# Patient Record
Sex: Male | Born: 1990 | Race: Black or African American | Hispanic: No | Smoking: Never smoker
Health system: Southern US, Community
[De-identification: ages and names within clinical notes are randomized; demographics above are authoritative.]

---

## 2014-05-01 ENCOUNTER — Emergency Department (HOSPITAL_COMMUNITY)
Admission: EM | Admit: 2014-05-01 | Discharge: 2014-05-01 | Disposition: A | Discharge status: Home / Self Care | Payer: BC Managed Care – PPO | Attending: Emergency Medicine | Admitting: Emergency Medicine

## 2014-05-01 ENCOUNTER — Encounter (HOSPITAL_COMMUNITY): Payer: Self-pay | Admitting: Emergency Medicine

## 2014-05-01 DIAGNOSIS — L739 Follicular disorder, unspecified: Secondary | ICD-10-CM | POA: Insufficient documentation

## 2014-05-01 DIAGNOSIS — K120 Recurrent oral aphthae: Secondary | ICD-10-CM | POA: Diagnosis not present

## 2014-05-01 DIAGNOSIS — R220 Localized swelling, mass and lump, head: Secondary | ICD-10-CM | POA: Diagnosis present

## 2014-05-01 MED ORDER — CEPHALEXIN 250 MG PO CAPS: 250.0000 mg | ORAL_CAPSULE | Freq: Four times a day (QID) | ORAL | Status: DC

## 2014-05-01 NOTE — ED Provider Notes (Signed)
CSN: [id number]     Arrival date & time 05/01/14  1806 History  This chart was scribed for non-physician practitioner, Raymon MuttonMarissa Nykeem Citro PA, working with Tilden FossaElizabeth Rees, MD, by Tanda RockersMargaux Venter, ED Scribe. This patient was seen in room TR08C/TR08C and the patient's care was started at 7:03 PM.    Chief Complaint  Patient presents with  . Abscess   The history is provided by the patient. No language interpreter was used.     HPI Comments: Russell Bernard is a 24 y.o. male with no pertinent past medical history who presents to the Emergency Department complaining of an abscess to his left lower lip that began 3 days ago. Pt states that it began as a whitehead. He mentions that the area has been increasing in size, swelling, and soreness in the last 3 days. He says that he has been applying a warm rag and soap to the area with no relief. Pt states that he goes to the barber shop for trimming of his face. He reports that the last time he went to the barber shop was a week ago. Pt also states that he has sores in his mouth that he noticed today. He describes the sores as "cuts" in his mouth. Pt reports that the pain is localized to his lip and denies any pain in his mouth from the sores.  He admits to being sexually active and practicing oral sex. Pt states that he always uses protection and his last encounter was last weekend. He denies drainage of the area around his lip as well as popping it at any time. Pt denies throat closing, tongue swelling, voice changes, blurred vision, neck pain or stiffness, difficulty swallowing, chest pain, shortness of breath, difficulty breathing, numbness or tingling to the face, or rashes, fever, chills. He also denies any recent travels.  PCP none   History reviewed. No pertinent past medical history. History reviewed. No pertinent past surgical history. No family history on file. History  Substance Use Topics  . Smoking status: Never Smoker   . Smokeless tobacco: Not on  file  . Alcohol Use: Yes    Review of Systems  Constitutional: Negative for fever and chills.  HENT: Positive for facial swelling and mouth sores. Negative for trouble swallowing and voice change.   Eyes: Negative for pain and visual disturbance.  Respiratory: Negative for shortness of breath.        No difficulty breathing.   Cardiovascular: Negative for chest pain.  Musculoskeletal: Negative for neck pain.  Skin: Negative for rash.       Abscess to left lower lip. No drainage.   Neurological: Negative for numbness.      Allergies  Review of patient's allergies indicates no known allergies.  Home Medications   Prior to Admission medications   Medication Sig Start Date End Date Taking? Authorizing Provider  cephALEXin (KEFLEX) 250 MG capsule Take 1 capsule (250 mg total) by mouth 4 (four) times daily. 05/01/14   Asah Lamay, PA-C   Triage Vitals: BP 134/94 mmHg  Pulse 77  Temp(Src) 98.1 F (36.7 C) (Oral)  Resp 17  Ht 5\' 9"  (1.753 m)  Wt 215 lb (97.523 kg)  BMI 31.74 kg/m2  SpO2 99%   Physical Exam  Constitutional: He is oriented to person, place, and time. He appears well-developed and well-nourished. No distress.  HENT:  Head: Normocephalic and atraumatic.  Mouth/Throat: Oropharynx is clear and moist. No oropharyngeal exudate.    Aphthous ulcer identified to the right  maxillary buccal mucosa noted - lateral to the right maxillary second premolar. Negative active drainage or bleeding noted. Mild discomfort upon palpation. Negative swelling. Negative erythema.  Eyes: Conjunctivae and EOM are normal. Pupils are equal, round, and reactive to light. Right eye exhibits no discharge. Left eye exhibits no discharge.  Neck: Normal range of motion. Neck supple. No tracheal deviation present.  Negative neck stiffness Negative nuchal rigidity Negative cervical lymphadenopathy Negative meningeal signs  Cardiovascular: Normal rate, regular rhythm and normal heart sounds.    Pulmonary/Chest: Effort normal and breath sounds normal. No respiratory distress. He has no wheezes. He has no rales.  Patient is able to speak in full sentences without difficulty Negative use of excess her muscles Negative stridor  Musculoskeletal: Normal range of motion.  Lymphadenopathy:    He has no cervical adenopathy.  Neurological: He is alert and oriented to person, place, and time. No cranial nerve deficit. He exhibits normal muscle tone. Coordination normal.  Skin: Skin is warm and dry.  Please see HEENT  Psychiatric: He has a normal mood and affect. His behavior is normal.  Nursing note and vitals reviewed.   ED Course  Procedures (including critical care time)  DIAGNOSTIC STUDIES: Oxygen Saturation is 99% on RA, normal by my interpretation.    COORDINATION OF CARE: 7:12 PM-Discussed treatment plan which includes HSV test and antibiotic prescription with pt at bedside and pt agreed to plan.   Labs Review Labs Reviewed  HSV 1 ANTIBODY, IGG  HSV 2 ANTIBODY, IGG    Imaging Review No results found.   EKG Interpretation None      MDM   Final diagnoses:  Folliculitis  Aphthous ulcer    Medications - No data to display  Filed Vitals:   05/01/14 1821  BP: 134/94  Pulse: 77  Temp: 98.1 F (36.7 C)  TempSrc: Oral  Resp: 17  Height:  (1.753 m)  Weight: 215 lb (97.523 kg)  SpO2: 99%   I personally performed the services described in this documentation, which was scribed in my presence. The recorded information has been reviewed and is accurate.  Patient presenting to the ED with a pustule localized to the lower lip, left aspects with surrounding swelling. Pustule was broken while in ED setting when pressure was applied-pus of a yellow/white color and blood was identified. Aphthous ulcer identified to the right maxillary buccal mucosa with negative active drainage or bleeding.  HSV 1 and 2 pending. Suspicion to be folliculitis and aphthous ulcer.  Patient stable, afebrile. Patient not septic appearing. Discharged patient. Discharge patient with Keflex. Discussed with patient to apply warm compressions and massage. Discussed with patient to report back to the ED with and 48 hours for reassessment. Discussed with patient to closely monitor symptoms and if symptoms are to worsen or change to report back to the ED - strict return instructions given.  Patient agreed to plan of care, understood, all questions answered.    Raymon Mutton, PA-C 05/01/14 1933  Tilden Fossa, MD 05/01/14 2325

## 2014-05-01 NOTE — ED Notes (Signed)
Pt reports he popped a pimple on his left lower lip on Wednesday and his lip has increased in size and became sore since, pt also reports that he has sores in his mouth.  Airway intact.

## 2014-05-01 NOTE — Discharge Instructions (Signed)
Please call your doctor for a followup appointment within 24-48 hours. When you talk to your doctor please let them know that you were seen in the emergency department and have them acquire all of your records so that they can discuss the findings with you and formulate a treatment plan to fully care for your new and ongoing problems. Please follow-up in the emergency department within 48 hours for reassessment of the lip Please take antibiotics as prescribed and on a full stomach Please rest and stay hydrated Please apply warm compressions at least 4-5 per day and massage at the lip Please gargle with warm water and salt for the canker sores Please avoid any sexual activity Please continue to monitor symptoms closely and if symptoms are to worsen or change (fever greater than 101, chills, sweating, nausea, vomiting, chest pain, shortness of breathe, difficulty breathing, weakness, numbness, tingling, worsening or changes to pain pattern, swelling to the lip, redness, active pus drainage, swelling to the jaw or to the gumline, bleeding) please report back to the Emergency Department immediately.    Canker Sores  Canker sores are painful, open sores on the inside of the mouth and cheek. They may be white or yellow. The sores usually heal in 1 to 2 weeks. Women are more likely than men to have recurrent canker sores. CAUSES The cause of canker sores is not well understood. More than one cause is likely. Canker sores do not appear to be caused by certain types of germs (viruses or bacteria). Canker sores may be caused by:  An allergic reaction to certain foods.  Digestive problems.  Not having enough vitamin B12, folic acid, and iron.  Male sex hormones. Sores may come only during certain phases of a menstrual cycle. Often, there is improvement during pregnancy.  Genetics. Some people seem to inherit canker sore problems. Emotional stress and injuries to the mouth may trigger outbreaks, but  not cause them.  DIAGNOSIS Canker sores are diagnosed by exam.  TREATMENT  Patients who have frequent bouts of canker sores may have cultures taken of the sores, blood tests, or allergy tests. This helps determine if their sores are caused by a poor diet, an allergy, or some other preventable or treatable disease.  Vitamins may prevent recurrences or reduce the severity of canker sores in people with poor nutrition.  Numbing ointments can relieve pain. These are available in drug stores without a prescription.  Anti-inflammatory steroid mouth rinses or gels may be prescribed by your caregiver for severe sores.  Oral steroids may be prescribed if you have severe, recurrent canker sores. These strong medicines can cause many side effects and should be used only under the close direction of a dentist or physician.  Mouth rinses containing the antibiotic medicine may be prescribed. They may lessen symptoms and speed healing. Healing usually happens in about 1 or 2 weeks with or without treatment. Certain antibiotic mouth rinses given to pregnant women and young children can permanently stain teeth. Talk to your caregiver about your treatment. HOME CARE INSTRUCTIONS   Avoid foods that cause canker sores for you.  Avoid citrus juices, spicy or salty foods, and coffee until the sores are healed.  Use a soft-bristled toothbrush.  Chew your food carefully to avoid biting your cheek.  Apply topical numbing medicine to the sore to help relieve pain.  Apply a thin paste of baking soda and water to the sore to help heal the sore.  Only use mouth rinses or medicines for  pain or discomfort as directed by your caregiver. SEEK MEDICAL CARE IF:   Your symptoms are not better in 1 week.  Your sores are still present after 2 weeks.  Your sores are very painful.  You have trouble breathing or swallowing.  Your sores come back frequently. Document Released: 06/10/2010 Document Revised:  06/10/2012 Document Reviewed: 06/10/2010 [facility] Patient Information 2015 [place], [place]. This information is not intended to replace advice given to you by your health care provider. Make sure you discuss any questions you have with your health care provider. Folliculitis  Folliculitis is redness, soreness, and swelling (inflammation) of the hair follicles. This condition can occur anywhere on the body. People with weakened immune systems, diabetes, or obesity have a greater risk of getting folliculitis. CAUSES  Bacterial infection. This is the most common cause.  Fungal infection.  Viral infection.  Contact with certain chemicals, especially oils and tars. Long-term folliculitis can result from bacteria that live in the nostrils. The bacteria may trigger multiple outbreaks of folliculitis over time. SYMPTOMS Folliculitis most commonly occurs on the scalp, thighs, legs, back, buttocks, and areas where hair is shaved frequently. An early sign of folliculitis is a small, white or yellow, pus-filled, itchy lesion (pustule). These lesions appear on a red, inflamed follicle. They are usually less than 0.2 inches (5 mm) wide. When there is an infection of the follicle that goes deeper, it becomes a boil or furuncle. A group of closely packed boils creates a larger lesion (carbuncle). Carbuncles tend to occur in hairy, sweaty areas of the body. DIAGNOSIS  Your caregiver can usually tell what is wrong by doing a physical exam. A sample may be taken from one of the lesions and tested in a lab. This can help determine what is causing your folliculitis. TREATMENT  Treatment may include:  Applying warm compresses to the affected areas.  Taking antibiotic medicines orally or applying them to the skin.  Draining the lesions if they contain a large amount of pus or fluid.  Laser hair removal for cases of long-lasting folliculitis. This helps to prevent regrowth of the hair. HOME CARE  INSTRUCTIONS  Apply warm compresses to the affected areas as directed by your caregiver.  If antibiotics are prescribed, take them as directed. Finish them even if you start to feel better.  You may take over-the-counter medicines to relieve itching.  Do not shave irritated skin.  Follow up with your caregiver as directed. SEEK IMMEDIATE MEDICAL CARE IF:   You have increasing redness, swelling, or pain in the affected area.  You have a fever. MAKE SURE YOU:  Understand these instructions.  Will watch your condition.  Will get help right away if you are not doing well or get worse. Document Released: 04/24/2001 Document Revised: 08/15/2011 Document Reviewed: 05/16/2011 [facility] Patient Information 2015 [place], [place]. This information is not intended to replace advice given to you by your health care provider. Make sure you discuss any questions you have with your health care provider.   Emergency Department Resource Guide 1) Find a Doctor and Pay Out of Pocket Although you won't have to find out who is covered by your insurance plan, it is a good idea to ask around and get recommendations. You will then need to call the office and see if the doctor you have chosen will accept you as a new patient and what types of options they offer for patients who are self-pay. Some doctors offer discounts or will set up payment plans for  their patients who do not have insurance, but you will need to ask so you aren't surprised when you get to your appointment.  2) Contact Your Local Health Department Not all health departments have doctors that can see patients for sick visits, but many do, so it is worth a call to see if yours does. If you don't know where your local health department is, you can check in your phone book. The CDC also has a tool to help you locate your state's health department, and many state websites also have listings of all of their local health departments.  3) Find a  Walk-in Clinic If your illness is not likely to be very severe or complicated, you may want to try a walk in clinic. These are popping up all over the country in pharmacies, drugstores, and shopping centers. They're usually staffed by nurse practitioners or physician assistants that have been trained to treat common illnesses and complaints. They're usually fairly quick and inexpensive. However, if you have serious medical issues or chronic medical problems, these are probably not your best option.  No Primary Care Doctor: - Call Health Connect at  [phone] - they can help you locate a primary care doctor that  accepts your insurance, provides certain services, etc. - Physician Referral Service- [phone]  Chronic Pain Problems: Organization         Address  Phone   Notes  [facility]  [phone] Patients need to be referred by their primary care doctor.   Medication Assistance: Organization         Address  Phone   Notes  [facility]ication [facility] [address] [place], [place] [address] [phone]) [phone] --Must be a resident of [place] -- Must have NO insurance coverage whatsoever (no Medicaid/ Medicare, etc.) -- The pt. MUST have a primary care doctor that directs their care regularly and follows them in the community   MedAssist  [phone]   [employer]  [phone]    Agencies that provide inexpensive medical care: Organization         Address  Phone   Notes  [facility]  [phone](336) [phone]   [facility]    [phone]) [phone]   [facility] [address] [place], [place] [address] [phone]) [phone]   [facility] [address], [place] [phone]   Planned Parenthood    [phone]) [phone]   [facility]    [phone]   [facility]  [address], [place] Phone:  [phone]) [phone], Fax:  [phone])  [phone] Hours of Operation:  9 am - 6 pm, M-F.  Also accepts Medicaid/Medicare and self-pay.  [facility]  [address], [place] Phone: [phone], Fax: [phone]. Hours of Operation:  8:30 am - 5:30 pm, M-F.  Also accepts Medicaid and self-pay.  [facility] [place] [address], [place] Phone: [phone]) [phone]   [facility] [address], [place], [place] [phone], Ext. [phone] Mondays & Thursdays: 7-9 AM.  First 15 patients are seen on a first come, first serve basis.    Medicaid-accepting [facility] Providers:  Organization         Address  Phone   Notes  [facility] [address], [place] [phone]) [phone] Also accepts self-pay patients.  [facility] [address], [place]  [phone]   [facility] [address], [place] [phone]   [facility] [address], [place] [phone]   Renaye Rakers [address], [place]   [phone] Only accepts [place] Access [place] patients after they have their name applied to their card.   Self-Pay (no insurance) in [facility]:  Organization         Address  Phone   Notes  Sickle Cell Patients, [facility] [address] [place], [place] [phone]   [facility] [address] [place], [place] [phone]   [facility] [place]  [address], [place] [phone]   [facility]/Dr. Osei-Bonsu  [address] [employer] or [address], [place] [phone] Phone number for both [place] and [place] locations is the same.  [facility]gent Medical [facility] [address] [place] [phone]   [facility] [address], [place] or [address] [phone]    [facility] [address], [place] [phone], phone; [phone], fax Sees patients 1st and 3rd Saturday of every month.  Must not qualify for public or private insurance (i.e. Medicaid, Medicare, [place] Health Choice, Veterans' Benefits)  Household income should be no more than 200% of the poverty level The clinic cannot treat you if you are pregnant or think you are pregnant  Sexually transmitted diseases are not treated at the clinic.    Dental Care: Organization         Address  Phone  Notes  [facility] of [facility] [address] [place], [place] [phone] Accepts children up to age 24 who are enrolled in [place] or [place] Health Choice; pregnant women with a Medicaid card; and children who have applied for Medicaid or [place] Health Choice, but were declined, whose parents can pay a reduced fee at time of service.  [facility] of [facility]  [address], [place] [phone] Accepts children up to age 16 who are enrolled in [place] or [place] Health Choice; pregnant women with a Medicaid card; and children who have applied for Medicaid or [place] Health Choice, but were declined, whose parents can pay a reduced fee at time of service.  [facility]  [address] [place], [place] [phone] Patients are seen by appointment only. Walk-ins are not accepted. [facility] will see patients 35 years of age and older. Monday - Tuesday (8am-5pm) Most Wednesdays (8:30-5pm) $30 per visit, cash only  [facility] Access [facility]  [address], [facility] [phone] Patients are seen by appointment only. Walk-ins are not accepted. [facility] will see patients 84 years of age and older. One Wednesday Evening (Monthly: Volunteer Based).  $30 per visit, cash only  [employer]  [phone] for adults; Children under age 73, call  Graduate Pediatric Dentistry at [phone]. Children aged 37-14, please call [phone] to request a pediatric application.  Dental services are provided in all areas of dental care including fillings, crowns and bridges, complete and partial dentures, implants, gum treatment, root canals, and extractions. Preventive care is also provided. Treatment is provided to both adults  and children. Patients are selected via a lottery and there is often a waiting list.   [facility] [address] [place]  [phone] [email or web address]   [facility] [address] [place], [place] [phone], Ext. [phone] Second and Fourth Thursday of each month, opens at 6:30 AM; Clinic ends at 9 AM.  Patients are seen on a first-come first-served basis, and a limited number are seen during each clinic.   [facility]  [address] Hillard Danker [place], [place] [phone]   Eligibility Requirements You must have lived in [place], [place], or [place] counties for at least the last three months.   You cannot be eligible for state or federal sponsored [employer], including [employer], [place], or [employer].   You generally cannot be eligible for healthcare insurance through your employer.    How to apply: Eligibility screenings are held every Tuesday and Wednesday afternoon from 1:00 pm until 4:00 pm. You do not need an appointment for the interview!  [facility] [address] [place], [place]   [place]  [place] Department  [place]  [phone]    Behavioral Health Resources in the Community: Intensive Outpatient Programs Organization         Address  Phone  Notes  [place]. [address], [place], [place] [phone]   [facility] Outpatient [address] [place], [place]   ADS: Alcohol & Drug Svcs [address] [place], [place]   [place] [address]  [place], [place] or [phone]   Substance Abuse Resources Organization         Address  Phone  Notes  Alcohol and Drug Services  [phone]   [place]  [phone]   The [place]   Chinita Pester  [phone]   Residential & Outpatient Substance Abuse Program  [phone]   Psychological Services Organization         Address  Phone  Notes  [facility] [place]  [place]  [phone]   [place] [address] [place] or [phone]    Mobile Crisis Teams Organization         Address  Phone  Notes  [facility], Mobile Crisis Care Unit  [phone]   [facility]  [address] [place], [place]   [place] Levels [address] [place] [phone]    Self-Help/Support Groups Organization         Address  Phone             Notes  [place]. of [place] - variety of support groups  [place] Call for more information  Narcotics Anonymous (NA), Caring [facility] [address], [employer] [place]  2 meetings at this location   Special educational needs teacher         Address  Phone  Notes  [facility]idential Treatment [place],    [place]  [phone]   [facility]  [address] [place] 151761, [place], [place]   Whitney Lealman, [place] [phone] Admissions: 8am-3pm M-F  Incentives Substance [place] [address]    [place], [place] [phone]   [facility] [address] [place], [place], [place]   The [place].,  [place], [place]   Insight Programs - Intensive Outpatient [address], [place], [place]   [facility] [address]  [place], [place] [phone] or [phone]   [facility] Services (RTS) [address] [place], [place] Accepts Medicaid  [facility] [place] [address]  [place] [phone] Substance Abuse/Addiction Treatment   [facility] Organization         Address  Phone  Notes  [facility]  [phone]   Domenic Schwab, PhD [address] Arlis Porta [place], [place]   [phone] or [phone]   [place], [place] [phone]   [address], [place], [place] [phone] Insurance/Medicaid/sponsorship through [facility] and Families [address]                                    [place], [place] [phone] [place] [address] [place], [place] [phone]    Dr. Adele Schilder  [phone]   [facility] Dept. 1) [address], [place] 2) [place] 3)  [place] [address], [place] [phone]  [phone]   [place] [phone] or [phone] (After Hours)

## 2014-05-03 LAB — HSV 2 ANTIBODY, IGG: HSV 2 Glycoprotein G Ab, IgG: <0.91 index (ref 0.00–0.90)

## 2014-05-03 LAB — HSV 1 ANTIBODY, IGG: HSV 1 Glycoprotein G Ab, IgG: <0.91 index (ref 0.00–0.90)

## 2016-03-29 ENCOUNTER — Other Ambulatory Visit (HOSPITAL_COMMUNITY): Payer: Self-pay | Admitting: Sports Medicine

## 2016-03-29 DIAGNOSIS — M25561 Pain in right knee: Secondary | ICD-10-CM

## 2016-04-06 ENCOUNTER — Ambulatory Visit (HOSPITAL_COMMUNITY): Payer: BC Managed Care – PPO

## 2016-04-10 ENCOUNTER — Encounter (HOSPITAL_COMMUNITY): Payer: Self-pay

## 2016-04-10 ENCOUNTER — Ambulatory Visit (HOSPITAL_COMMUNITY)
Admission: RE | Admit: 2016-04-10 | Discharge: 2016-04-10 | Disposition: A | Discharge status: Home / Self Care | Payer: BC Managed Care – PPO | Source: Ambulatory Visit | Attending: Sports Medicine | Admitting: Sports Medicine

## 2016-05-02 ENCOUNTER — Encounter (HOSPITAL_COMMUNITY): Payer: Self-pay | Admitting: Emergency Medicine

## 2016-05-02 ENCOUNTER — Ambulatory Visit (HOSPITAL_COMMUNITY)
Admission: EM | Admit: 2016-05-02 | Discharge: 2016-05-02 | Disposition: A | Discharge status: Home / Self Care | Payer: BC Managed Care – PPO | Attending: Family Medicine | Admitting: Family Medicine

## 2016-05-02 ENCOUNTER — Ambulatory Visit (INDEPENDENT_AMBULATORY_CARE_PROVIDER_SITE_OTHER): Payer: BC Managed Care – PPO

## 2016-05-02 DIAGNOSIS — S161XXA Strain of muscle, fascia and tendon at neck level, initial encounter: Secondary | ICD-10-CM

## 2016-05-02 MED ORDER — DICLOFENAC SODIUM 75 MG PO TBEC: 75.0000 mg | DELAYED_RELEASE_TABLET | Freq: Two times a day (BID) | ORAL | 0 refills | Status: AC

## 2016-05-02 MED ORDER — CYCLOBENZAPRINE HCL 5 MG PO TABS: 5.0000 mg | ORAL_TABLET | Freq: Every day | ORAL | 0 refills | Status: AC
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
## 2020-06-16 ENCOUNTER — Ambulatory Visit
Admission: RE | Admit: 2020-06-16 | Discharge: 2020-06-16 | Disposition: A | Discharge status: Home / Self Care | Payer: BC Managed Care – PPO | Source: Ambulatory Visit | Attending: Internal Medicine | Admitting: Internal Medicine

## 2020-06-16 ENCOUNTER — Other Ambulatory Visit: Payer: Self-pay | Admitting: Internal Medicine

## 2020-06-16 DIAGNOSIS — M545 Low back pain, unspecified: Secondary | ICD-10-CM
